# Patient Record
Sex: Male | Born: 1975 | Race: White | Hispanic: No | Marital: Single | Smoking: Former smoker
Health system: Southern US, Community
[De-identification: ages and names within clinical notes are randomized; demographics above are authoritative.]

## PROBLEM LIST (undated history)

## (undated) DIAGNOSIS — F1110 Opioid abuse, uncomplicated: Secondary | ICD-10-CM

---

## 2004-10-27 ENCOUNTER — Emergency Department (HOSPITAL_COMMUNITY): Admission: EM | Admit: 2004-10-27 | Discharge: 2004-10-27 | Payer: Self-pay | Admitting: Emergency Medicine

## 2004-11-01 ENCOUNTER — Emergency Department (HOSPITAL_COMMUNITY): Admission: EM | Admit: 2004-11-01 | Discharge: 2004-11-01 | Payer: Self-pay | Admitting: Emergency Medicine

## 2004-11-01 ENCOUNTER — Emergency Department (HOSPITAL_COMMUNITY): Admission: EM | Admit: 2004-11-01 | Discharge: 2004-11-01 | Payer: Self-pay | Admitting: Family Medicine

## 2004-12-13 ENCOUNTER — Ambulatory Visit (HOSPITAL_COMMUNITY): Admission: RE | Admit: 2004-12-13 | Discharge: 2004-12-13 | Payer: Self-pay | Admitting: Orthopedic Surgery

## 2004-12-13 ENCOUNTER — Ambulatory Visit (HOSPITAL_BASED_OUTPATIENT_CLINIC_OR_DEPARTMENT_OTHER): Admission: RE | Admit: 2004-12-13 | Discharge: 2004-12-13 | Payer: Self-pay | Admitting: Orthopedic Surgery

## 2005-01-27 ENCOUNTER — Emergency Department (HOSPITAL_COMMUNITY): Admission: AD | Admit: 2005-01-27 | Discharge: 2005-01-27 | Payer: Self-pay | Admitting: Family Medicine

## 2005-02-21 ENCOUNTER — Ambulatory Visit (HOSPITAL_COMMUNITY): Admission: RE | Admit: 2005-02-21 | Discharge: 2005-02-21 | Payer: Self-pay | Admitting: Orthopedic Surgery

## 2005-02-21 ENCOUNTER — Ambulatory Visit (HOSPITAL_BASED_OUTPATIENT_CLINIC_OR_DEPARTMENT_OTHER): Admission: RE | Admit: 2005-02-21 | Discharge: 2005-02-21 | Payer: Self-pay | Admitting: Orthopedic Surgery

## 2006-02-08 IMAGING — CR DG FOOT COMPLETE 3+V*L*
3 series · 3 of 3 positions shown · non-contrast
Comparison: none

CLINICAL DATA: Dropped can of paint on foot.  Bruising and swelling.   History of surgery five weeks ago.  
 LEFT FOOT ? 3 VIEWS:

[view not recorded (1 of 3)]
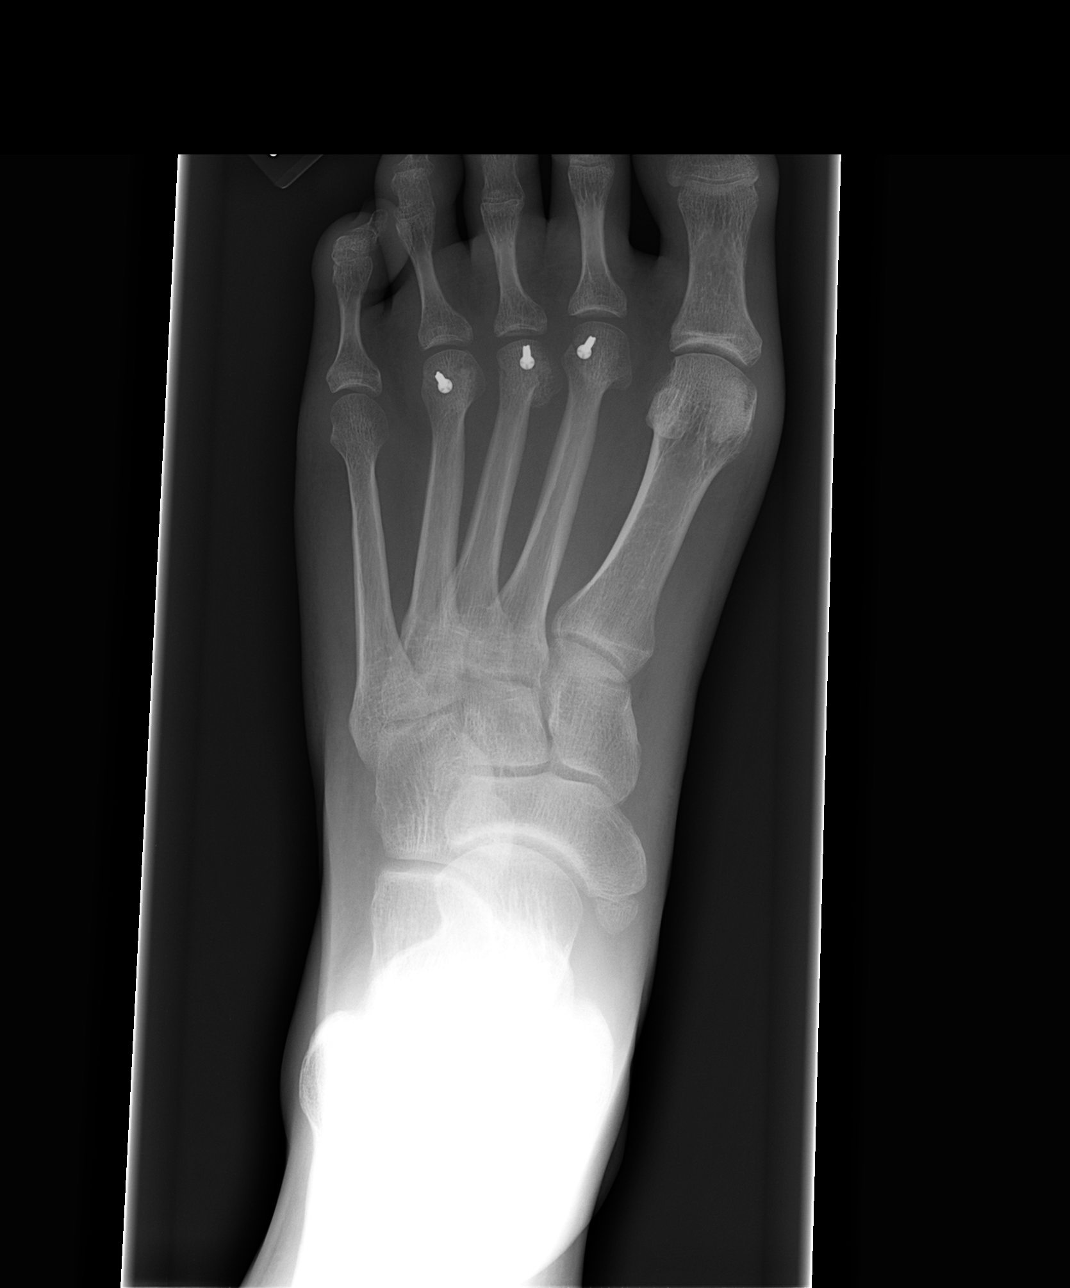

[view not recorded (2 of 3)]
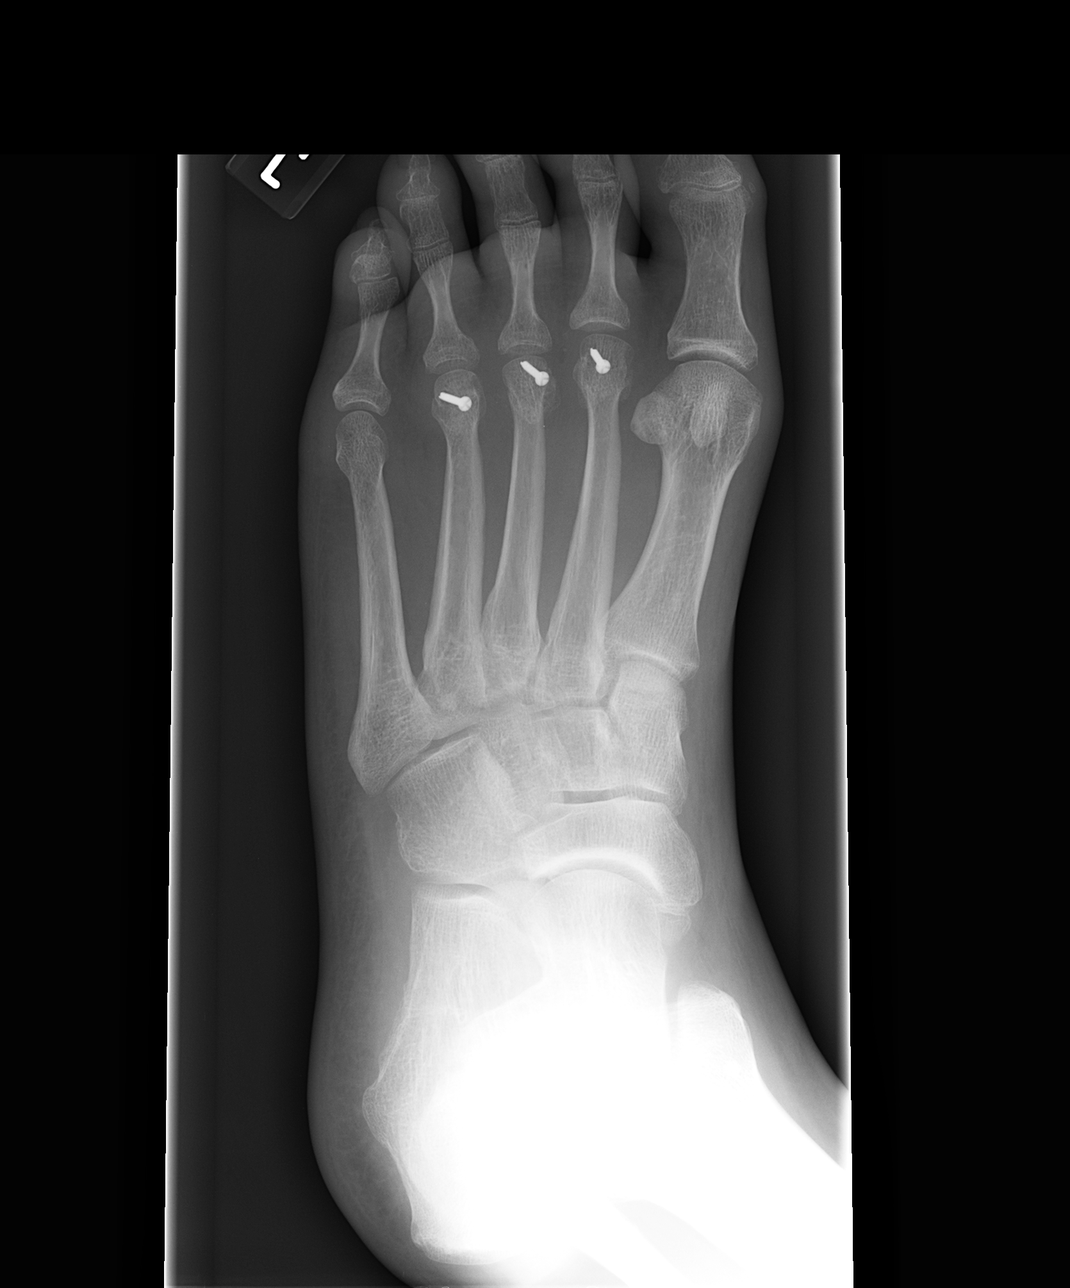

[view not recorded (3 of 3)]
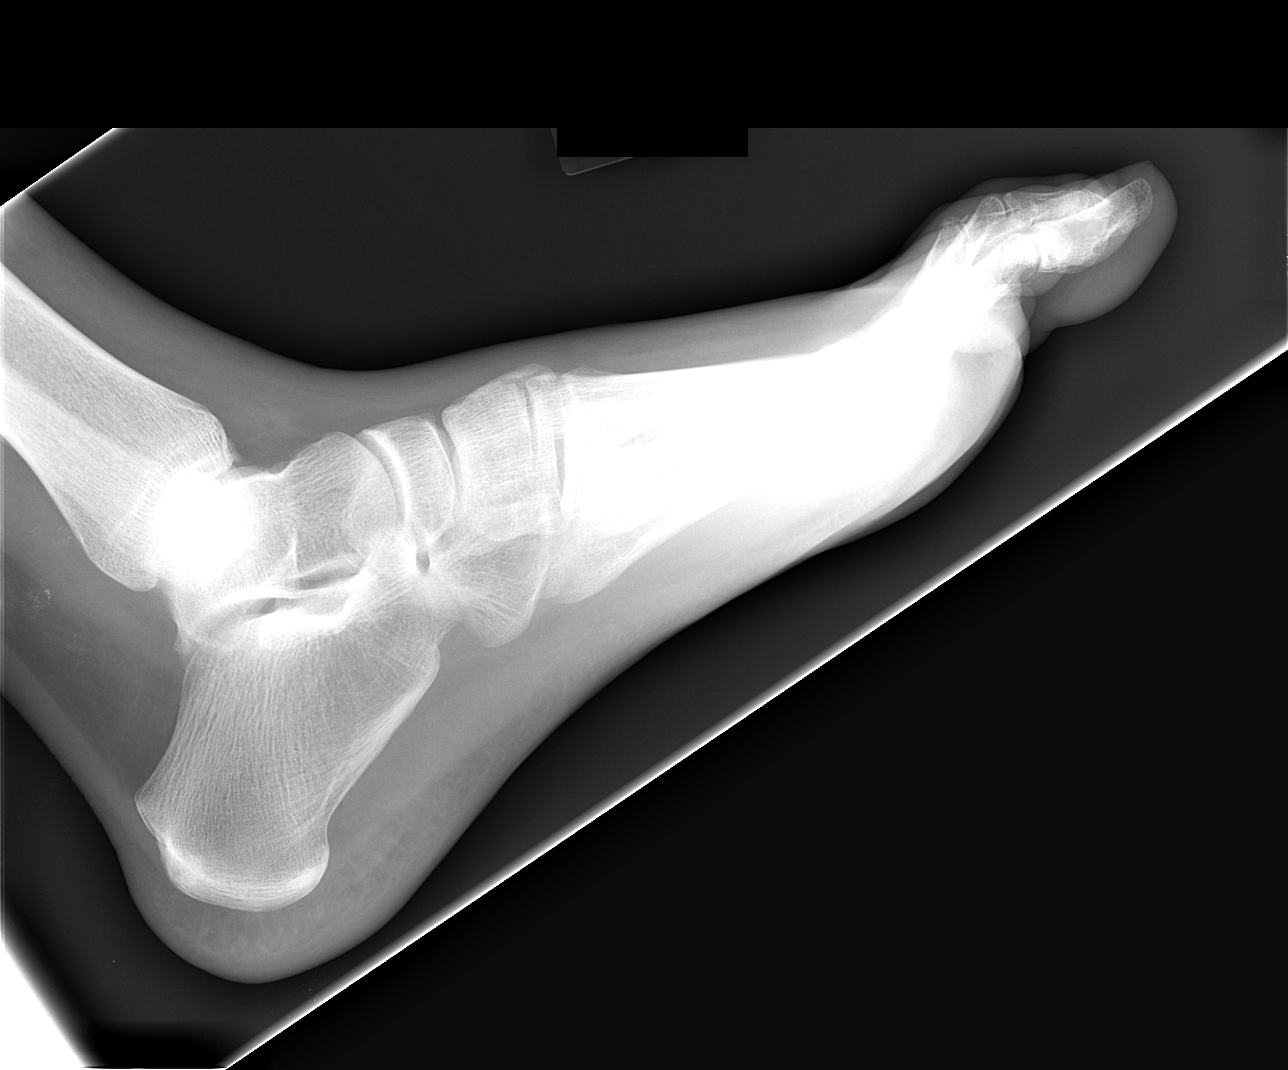

[3 of 3 positions shown; findings below may reference images not displayed]

FINDINGS: The patient has had surgery involving the left second, third, and fourth metatarsal heads.  Lucency near the screws may be related to the surgery.  Superimposed acute fracture would be difficult to exclude given this appearance.  Otherwise no acute fracture or dislocation.
IMPRESSION: Postoperative changes left second, third and fourth metatarsal heads.  Fracture at this level would be difficult to exclude given the recent postoperative changes.  No other fracture identified.

## 2012-01-07 ENCOUNTER — Encounter (HOSPITAL_COMMUNITY): Payer: Self-pay | Admitting: Emergency Medicine

## 2012-01-07 ENCOUNTER — Emergency Department (HOSPITAL_COMMUNITY)
Admission: EM | Admit: 2012-01-07 | Discharge: 2012-01-07 | Disposition: A | Discharge status: Home / Self Care | Payer: Self-pay | Attending: Emergency Medicine | Admitting: Emergency Medicine

## 2012-01-07 DIAGNOSIS — T403X4A Poisoning by methadone, undetermined, initial encounter: Secondary | ICD-10-CM | POA: Insufficient documentation

## 2012-01-07 DIAGNOSIS — T400X1A Poisoning by opium, accidental (unintentional), initial encounter: Secondary | ICD-10-CM | POA: Insufficient documentation

## 2012-01-07 DIAGNOSIS — T40601A Poisoning by unspecified narcotics, accidental (unintentional), initial encounter: Secondary | ICD-10-CM | POA: Insufficient documentation

## 2012-01-07 DIAGNOSIS — T6591XA Toxic effect of unspecified substance, accidental (unintentional), initial encounter: Secondary | ICD-10-CM

## 2012-01-07 DIAGNOSIS — T403X1A Poisoning by methadone, accidental (unintentional), initial encounter: Secondary | ICD-10-CM | POA: Insufficient documentation

## 2012-01-07 LAB — CBC WITH DIFFERENTIAL/PLATELET
HCT: 43.8 % (ref 39.0–52.0)
Lymphocytes Relative: 22 % (ref 12–46)
MCH: 33.4 pg (ref 26.0–34.0)
MCHC: 34.7 g/dL (ref 30.0–36.0)
Neutro Abs: 6.4 10*3/uL (ref 1.7–7.7)
RDW: 12.5 % (ref 11.5–15.5)
WBC: 9.4 10*3/uL (ref 4.0–10.5)

## 2012-01-07 LAB — COMPREHENSIVE METABOLIC PANEL
ALT: 19 U/L (ref 0–53)
AST: 17 U/L (ref 0–37)
Albumin: 4.5 g/dL (ref 3.5–5.2)
CO2: 19 mEq/L (ref 19–32)
Chloride: 98 mEq/L (ref 96–112)
GFR calc Af Amer: >90 mL/min (ref >90)
Glucose, Bld: 94 mg/dL (ref 70–99)
Total Bilirubin: 0.5 mg/dL (ref 0.3–1.2)

## 2012-01-07 LAB — ACETAMINOPHEN LEVEL: Acetaminophen (Tylenol), Serum: 23.2 ug/mL (ref 10–30)

## 2012-01-07 LAB — RAPID URINE DRUG SCREEN, HOSP PERFORMED
Amphetamines: NOT DETECTED
Barbiturates: NOT DETECTED
Cocaine: NOT DETECTED
Opiates: NOT DETECTED
Tetrahydrocannabinol: POSITIVE — AB

## 2012-01-07 MED ORDER — POTASSIUM CHLORIDE CRYS ER 20 MEQ PO TBCR
40.0000 meq | EXTENDED_RELEASE_TABLET | Freq: Once | ORAL | Status: AC
  Administered 2012-01-07: 40 meq via ORAL
  Filled 2012-01-07: qty 2

## 2012-01-07 NOTE — ED Notes (Signed)
Pt reports taking 10 5mg  oxycodone and 2 5 mg methadone about 2 hours ago.  Pt denies having any SI.  Pt calm and cooperative.  Pt reports taking 10 10mg  of oxycodone twice a day for about 10 years.  Pt reports being under treatment for withdrawal that started this past weekend.  Prior to today, the last time the pt reports taking any oxycodone was this past Saturday.

## 2012-01-07 NOTE — ED Notes (Signed)
Pt took 10 Oxycodone 5mg  and 2 methadone pills at 1915. Pt BIB EMS. EMS states patient still had pill fragments in his mouth and they made him spit out pill fragments. Pt denies SI. Pt is calm, cooperative. EMS reports that police were trying to arrest patient for buying oxycodone on the street when pt ingested the pills. Pt admits to narcotic dependency per EMS.

## 2012-01-07 NOTE — ED Notes (Signed)
Pt. Was placed into blue scrubs due to Chief Complaint of OD. Pt was placed on heart monitor and BP is cycling every 30 min. Pt. Was made aware that UA was needed, Pt. Stated that he would notify ED staff when he was able to void.

## 2012-01-07 NOTE — ED Notes (Signed)
[id number]<AV> Expected date:<BR> Expected time:<BR> Means of arrival:<BR> Comments:<BR> EMS/overdose

## 2012-01-07 NOTE — ED Provider Notes (Addendum)
History     CSN: [id number]  Arrival date & time 01/07/12  1941   First MD Initiated Contact with Patient 01/07/12 2044      Chief Complaint  Patient presents with  . Ingestion    (Consider location/radiation/quality/duration/timing/severity/associated sxs/prior treatment) Patient is a 36 y.o. male presenting with Ingested Medication. The history is provided by the patient.  Ingestion   Patient here after ingesting 10 oxycodone tablets 5 mg strength and to methadone pills approximately 2 hours prior to arrival. Patient states that he was attempting to chew them up he spit out the majority of the contents of his mouth. This was due to him being arrested by police. States that he has narcotic addiction and denies a suicide attempt. States he did not want to go through withdrawal which is why he had the opiates. He is currently asymptomatic at this time History reviewed. No pertinent past medical history.  History reviewed. No pertinent past surgical history.  No family history on file.  History  Substance Use Topics  . Smoking status: Not on file  . Smokeless tobacco: Not on file  . Alcohol Use: Not on file      Review of Systems  All other systems reviewed and are negative.    Allergies  Review of patient's allergies indicates no known allergies.  Home Medications   Current Outpatient Rx  Name Route Sig Dispense Refill  . GABAPENTIN 300 MG PO CAPS Oral Take 300 mg by mouth 3 (three) times daily.      BP 156/100  Pulse 115  Temp 99.3 F (37.4 C) (Oral)  Resp 16  SpO2 97%  Physical Exam  Nursing note and vitals reviewed. Constitutional: He is oriented to person, place, and time. He appears well-developed and well-nourished.  Non-toxic appearance. No distress.  HENT:  Head: Normocephalic and atraumatic.  Eyes: Conjunctivae, EOM and lids are normal. Pupils are equal, round, and reactive to light.  Neck: Normal range of motion. Neck supple. No tracheal  deviation present. No mass present.  Cardiovascular: Regular rhythm and normal heart sounds.  Tachycardia present.  Exam reveals no gallop.   No murmur heard. Pulmonary/Chest: Effort normal and breath sounds normal. No stridor. No respiratory distress. He has no decreased breath sounds. He has no wheezes. He has no rhonchi. He has no rales.  Abdominal: Soft. Normal appearance and bowel sounds are normal. He exhibits no distension. There is no tenderness. There is no rebound and no CVA tenderness.  Musculoskeletal: Normal range of motion. He exhibits no edema and no tenderness.  Neurological: He is alert and oriented to person, place, and time. He has normal strength. No cranial nerve deficit or sensory deficit. GCS eye subscore is 4. GCS verbal subscore is 5. GCS motor subscore is 6.  Skin: Skin is warm and dry. No abrasion and no rash noted.  Psychiatric: He has a normal mood and affect. His speech is normal and behavior is normal. He expresses no homicidal and no suicidal ideation.    ED Course  Procedures (including critical care time)  Labs Reviewed  COMPREHENSIVE METABOLIC PANEL - Abnormal; Notable for the following:    Sodium 133 (*)     Potassium 3.1 (*)     All other components within normal limits  CBC WITH DIFFERENTIAL  ACETAMINOPHEN LEVEL  URINE RAPID DRUG SCREEN (HOSP PERFORMED)   No results found.   No diagnosis found.    MDM  Patient has no signs of opiate toxidrome. He has  no myosis he is awake and alert. His speech is normal. He'll be discharged   Pt given potassium for his mild hypoklamlemia    Toy Baker, MD 01/07/12 2130  Toy Baker, MD 01/07/12 2147

## 2015-11-09 ENCOUNTER — Encounter (HOSPITAL_BASED_OUTPATIENT_CLINIC_OR_DEPARTMENT_OTHER): Payer: Self-pay | Admitting: Emergency Medicine

## 2015-11-09 ENCOUNTER — Emergency Department (HOSPITAL_BASED_OUTPATIENT_CLINIC_OR_DEPARTMENT_OTHER)
Admission: EM | Admit: 2015-11-09 | Discharge: 2015-11-09 | Disposition: A | Discharge status: Home / Self Care | Payer: Self-pay | Attending: Emergency Medicine | Admitting: Emergency Medicine

## 2015-11-09 DIAGNOSIS — Z87891 Personal history of nicotine dependence: Secondary | ICD-10-CM | POA: Insufficient documentation

## 2015-11-09 DIAGNOSIS — L237 Allergic contact dermatitis due to plants, except food: Secondary | ICD-10-CM | POA: Insufficient documentation

## 2015-11-09 HISTORY — DX: Opioid abuse, uncomplicated: F11.10

## 2015-11-09 MED ORDER — PREDNISONE 20 MG PO TABS: 40.0000 mg | ORAL_TABLET | Freq: Every day | ORAL | Status: AC

## 2015-11-09 NOTE — ED Notes (Signed)
[facility] iv[facility] on left leg

## 2015-11-09 NOTE — Discharge Instructions (Signed)

## 2015-11-09 NOTE — ED Provider Notes (Signed)
CSN: [id number]     Arrival date & time 11/09/15  1048 History   First MD Initiated Contact with Patient 11/09/15 1112     Chief Complaint  Patient presents with  . Poison Ivy     (Consider location/radiation/quality/duration/timing/severity/associated sxs/prior Treatment) HPI   40 year old male presents emergency Department with chief complaint of poison ivy. Patient states that he has had poison ivy on his legs, arms and chest for the past 3 weeks. He states he got otherwise, working outside. He continues to have small patches of very intensely itchy skin. The patient is [facility] in [facility], and was sent in by the nursing staff to make sure it wasn't contagious. He denies fevers, chills, pain, discharge from the sites.  Past Medical History  Diagnosis Date  . Opiate abuse, episodic    History reviewed. No pertinent past surgical history. No family history on file. Social History  Substance Use Topics  . Smoking status: Former Games developermoker  . Smokeless tobacco: None  . Alcohol Use: No    Review of Systems  Ten systems reviewed and are negative for acute change, except as noted in the HPI.    Allergies  Review of patient's allergies indicates no known allergies.  Home Medications   Prior to Admission medications   Medication Sig Start Date End Date Taking? Authorizing Provider  gabapentin (NEURONTIN) 300 MG capsule Take 300 mg by mouth 3 (three) times daily.    Historical Provider, MD  predniSONE (DELTASONE) 20 MG tablet Take 2 tablets (40 mg total) by mouth daily. 11/09/15   Amontae Ng, PA-C   BP 136/78 mmHg  Pulse 55  Resp 16  Ht 6\' 3"  (1.905 m)  Wt 104.327 kg  BMI 28.75 kg/m2  SpO2 99% Physical Exam  Constitutional: He appears well-developed and well-nourished. No distress.  HENT:  Head: Normocephalic and atraumatic.  Eyes: Conjunctivae are normal. No scleral icterus.  Neck: Normal range of motion. Neck supple.  Cardiovascular: Normal rate,  regular rhythm and normal heart sounds.   Pulmonary/Chest: Effort normal and breath sounds normal. No respiratory distress.  Abdominal: Soft. There is no tenderness.  Musculoskeletal: He exhibits no edema.  Neurological: He is alert.  Skin: Skin is warm and dry. He is not diaphoretic.  Multiple areas of confluent vesicular lesions in various states of eruption, somewhat linear streaking. Some weeping. No purulent discharge, tenderness to palpation.  Psychiatric: His behavior is normal.  Nursing note and vitals reviewed.   ED Course  Procedures (including critical care time) Labs Review Labs Reviewed - No data to display  Imaging Review No results found. I have personally reviewed and evaluated these images and lab results as part of my medical decision-making.   EKG Interpretation None      MDM   Final diagnoses:  Poison ivy dermatitis    Patient be discharged with diagnosis of poison ivy. No signs of infection. Discharged with hydrocortisone 1% ointment and prednisone burst. Appears safe for discharge at this time. Discussed return precautions.    Arthor Captainbigail Nicolis Boody, PA-C 11/09/15 1630  Doug SouSam Jacubowitz, MD 11/10/15 [phone]
